# Patient Record
Sex: Male | Born: 2005 | Hispanic: No | Marital: Single | Smoking: Never smoker
Health system: Southern US, Community
[De-identification: ages and names within clinical notes are randomized; demographics above are authoritative.]

## PROBLEM LIST (undated history)

## (undated) DIAGNOSIS — H539 Unspecified visual disturbance: Secondary | ICD-10-CM

## (undated) HISTORY — PX: NO PAST SURGERIES: SHX2092

---
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
## 2023-06-16 ENCOUNTER — Ambulatory Visit: Payer: Self-pay | Admitting: Podiatry

## 2023-06-30 ENCOUNTER — Ambulatory Visit: Payer: Self-pay | Admitting: Podiatry

## 2023-07-21 ENCOUNTER — Ambulatory Visit: Payer: Self-pay | Admitting: Podiatry

## 2024-03-22 ENCOUNTER — Encounter: Payer: Self-pay | Admitting: Podiatry

## 2024-03-22 ENCOUNTER — Ambulatory Visit (INDEPENDENT_AMBULATORY_CARE_PROVIDER_SITE_OTHER): Admitting: Podiatry

## 2024-03-22 DIAGNOSIS — B351 Tinea unguium: Secondary | ICD-10-CM

## 2024-03-22 MED ORDER — TERBINAFINE HCL 250 MG PO TABS: 250.0000 mg | ORAL_TABLET | Freq: Every day | ORAL | 0 refills | Status: AC

## 2024-03-22 NOTE — Progress Notes (Signed)
   Chief Complaint  Patient presents with   Nail Problem    Hallux left - thick, discolored nail x years, trimmed down at last appointment, seemed to grow out okay, but now has turned dark again   New Patient (Initial Visit)    Est pt 2023    Subjective: 18 y.o. male presenting today for evaluation of thickening with discoloration to the left hallux nail plate times several years.  He has tried antifungal topical with no improvement.  Past Medical History:  Diagnosis Date   wears glasses     Past Surgical History:  Procedure Laterality Date   NO PAST SURGERIES     PILONIDAL CYST EXCISION N/A 10/11/2019   Procedure: EXCISION PILONIDAL CYST PEDIATRIC;  Surgeon: Chuckie Casimiro KIDD, MD;  Location: [facility];  Service: Pediatrics;  Laterality: N/A;   PILONIDAL CYST EXCISION N/A 04/10/2020   Procedure: EXCISION PILONIDAL CYST PEDIATRIC;  Surgeon: Chuckie Casimiro KIDD, MD;  Location: [facility];  Service: Pediatrics;  Laterality: N/A;    No Known Allergies   LT great toe 03/22/2024  Objective: Physical Exam General: The patient is alert and oriented x3 in no acute distress.  Dermatology: Hyperkeratotic, discolored, thickened, onychodystrophy noted. Skin is warm, dry and supple bilateral lower extremities. Negative for open lesions or macerations.  Vascular: Palpable pedal pulses bilaterally. No edema or erythema noted. Capillary refill within normal limits.  Neurological: Grossly intact via light touch  Musculoskeletal Exam: No pedal deformity noted  Assessment: #1 Onychomycosis of toenail left hallux nail plate  Plan of Care:  -Patient was evaluated. -Today we discussed different treatment options including oral, topical, and laser antifungal treatment modalities.  We discussed their efficacies and side effects.  Patient opts for oral antifungal treatment modality -Prescription for Lamisil  250 mg #90 daily. Pt denies a history of liver pathology or  symptoms.  Patient is otherwise healthy -Return to clinic 6 months   Thresa EMERSON Sar, DPM [facility]  Dr. Thresa EMERSON Sar, DPM    [address] [place], [place] [address]                Office [phone]  Fax [phone]

## 2024-03-22 NOTE — Progress Notes (Signed)
 SABRA

## 2024-06-23 ENCOUNTER — Other Ambulatory Visit: Payer: Self-pay | Admitting: Lab

## 2024-06-23 DIAGNOSIS — Z9229 Personal history of other drug therapy: Secondary | ICD-10-CM

## 2024-06-23 DIAGNOSIS — B351 Tinea unguium: Secondary | ICD-10-CM

## 2024-06-30 ENCOUNTER — Telehealth: Payer: Self-pay | Admitting: Lab

## 2024-06-30 NOTE — Telephone Encounter (Signed)
 Patient called stating doesn't want to do the oral treatment if you can call him in a topical for his nail fungus.

## 2024-09-22 ENCOUNTER — Ambulatory Visit: Admitting: Podiatry

## 2024-09-22 VITALS — Ht 67.21 in | Wt 161.0 lb

## 2024-09-22 DIAGNOSIS — L600 Ingrowing nail: Secondary | ICD-10-CM | POA: Diagnosis not present

## 2024-09-22 NOTE — Progress Notes (Signed)
" ° °  Chief Complaint  Patient presents with   Nail Problem    RM 7 Patient is here for nail fungus of the left hallux. Left hallux nail is lifting from the rear, patient states a possible removal.    Subjective: 19 y.o. male presenting today for follow-up relation of nail dystrophy to the left hallux nail plate.  Since last visit he completed the oral Lamisil  without any complications or issues.  He continues to have multiple layers of the toenail with complete dystrophy of the nail plate although the base of the nail does demonstrate some healthy regrowth the distal aspect of the nail is growing into his skin and causing an ingrown  Past Medical History:  Diagnosis Date   wears glasses     Past Surgical History:  Procedure Laterality Date   NO PAST SURGERIES     PILONIDAL CYST EXCISION N/A 10/11/2019   Procedure: EXCISION PILONIDAL CYST PEDIATRIC;  Surgeon: Chuckie Casimiro KIDD, MD;  Location: [facility];  Service: Pediatrics;  Laterality: N/A;   PILONIDAL CYST EXCISION N/A 04/10/2020   Procedure: EXCISION PILONIDAL CYST PEDIATRIC;  Surgeon: Chuckie Casimiro KIDD, MD;  Location: [facility];  Service: Pediatrics;  Laterality: N/A;    No Known Allergies   LT great toe 03/22/2024  Objective: Physical Exam General: The patient is alert and oriented x3 in no acute distress.  Dermatology: Hyperkeratotic, discolored, thickened, onychodystrophy noted.  Ingrowing portion of nail noted to the medial aspect of the left great toenail consistent with ingrowing toenail.  Skin is warm, dry and supple bilateral lower extremities. Negative for open lesions or macerations.  Vascular: Palpable pedal pulses bilaterally. No edema or erythema noted. Capillary refill within normal limits.  Neurological: Grossly intact via light touch  Musculoskeletal Exam: No pedal deformity noted  Assessment: #1 Onychomycosis of toenail left hallux nail plate #2 ingrown toenail left  Plan of  Care:  -Patient was evaluated. - After evaluating today I do believe it would be appropriate to perform a total temporary nail avulsion and allow the new nail to potentially grow and healthy and viable.  This would also alleviate his ingrowing toenail which is very symptomatic -The procedure was discussed with the patient.  He agrees would like to proceed -The toenail was prepped in aseptic manner and digital block performed using 3 mL of 2% lidocaine  plain.  The nail was avulsed in its entirety with dressings applied.  Post care instructions were provided -Return to clinic PRN   Thresa EMERSON Sar, DPM [facility]  Dr. Thresa EMERSON Sar, DPM    [address] [place], [place] [address]                Office [phone]  Fax [phone]     "

## 2024-09-22 NOTE — Patient Instructions (Signed)
